# Patient Record
Sex: Male | Born: 1980 | Race: White | Hispanic: No | Marital: Married | Smoking: Former smoker
Health system: Southern US, Community
[De-identification: ages and names within clinical notes are randomized; demographics above are authoritative.]

## PROBLEM LIST (undated history)

## (undated) DIAGNOSIS — I10 Essential (primary) hypertension: Secondary | ICD-10-CM

## (undated) HISTORY — DX: Essential (primary) hypertension: I10

---

## 2014-08-02 ENCOUNTER — Ambulatory Visit: Payer: Self-pay | Admitting: Physician Assistant

## 2014-11-24 ENCOUNTER — Ambulatory Visit
Admit: 2014-11-24 | Disposition: A | Discharge status: Home / Self Care | Payer: Self-pay | Attending: Family Medicine | Admitting: Family Medicine

## 2015-12-21 ENCOUNTER — Encounter: Payer: Self-pay | Admitting: Emergency Medicine

## 2015-12-21 ENCOUNTER — Ambulatory Visit
Admission: EM | Admit: 2015-12-21 | Discharge: 2015-12-21 | Disposition: A | Discharge status: Home / Self Care | Payer: BLUE CROSS/BLUE SHIELD | Attending: Family Medicine | Admitting: Family Medicine

## 2015-12-21 DIAGNOSIS — H6123 Impacted cerumen, bilateral: Secondary | ICD-10-CM

## 2015-12-21 MED ORDER — CARBAMIDE PEROXIDE 6.5 % OT SOLN: 5.0000 [drp] | Freq: Two times a day (BID) | OTIC | Status: DC

## 2015-12-21 NOTE — ED Provider Notes (Signed)
CSN: [id number]     Arrival date & time 12/21/15  1914 History   First MD Initiated Contact with Patient 12/21/15 [phone]    Nurses notes were reviewed. Chief Complaint  Patient presents with  . Ear Fullness   (Consider location/radiation/quality/duration/timing/severity/associated sxs/prior Treatment) Patient is a 35 y.o. Chris [name] presenting with plugged ear sensation. The history is provided by the patient.  Ear Fullness This is a new problem. The current episode started more than 2 days ago. The problem occurs constantly. The problem has not changed since onset.Pertinent negatives include no chest pain, no abdominal pain and no headaches. Nothing aggravates the symptoms. Nothing relieves the symptoms. He has tried nothing for the symptoms.    Patient's history of excess wax buildup in the ears. He reports last few days increased pressure in his ears on the left ear he can feel something that there was fill on Saturday but today the right ear was felt completely full quickly came in to be seen. He denies any medical problems no pertinent family medical history unfortunately was warned any stop smoking. No history of drug allergies.    History reviewed. No pertinent past medical history. History reviewed. No pertinent past surgical history. History reviewed. No pertinent family history. Social History  Substance Use Topics  . Smoking status: Current Every Day Smoker -- 1.00 packs/day    Types: Cigarettes  . Smokeless tobacco: None  . Alcohol Use: Yes    Review of Systems  Cardiovascular: Negative for chest pain.  Gastrointestinal: Negative for abdominal pain.  Neurological: Negative for headaches.  All other systems reviewed and are negative.   Allergies  Review of patient's allergies indicates no known allergies.  Home Medications   Prior to Admission medications   Medication Sig Start Date End Date Taking? Authorizing Provider  carbamide peroxide (DEBROX) 6.5 % otic solution  Place 5 drops into both ears 2 (two) times daily. 12/21/15   Hassan Rowan, MD   Meds Ordered and Administered this Visit  Medications - No data to display  BP 137/87 mmHg  Pulse Chris  Temp(Src) 97 F (36.1 C) (Tympanic)  Resp 16  Ht  (1.651 m)  Wt 150 lb (68.04 kg)  BMI 24.96 kg/m2  SpO2 100% No data found.   Physical Exam  Constitutional: He is oriented to person, place, and time. He appears well-developed and well-nourished.  HENT:  Head: Normocephalic and atraumatic.  Right Ear: A foreign body is present.  Left Ear: A foreign body is present.  Nose: Right sinus exhibits no maxillary sinus tenderness and no frontal sinus tenderness. Left sinus exhibits no maxillary sinus tenderness and no frontal sinus tenderness.  Mouth/Throat: Oropharynx is clear and moist and mucous membranes are normal. No dental caries.  Eyes: Conjunctivae are normal. Pupils are equal, round, and reactive to light.  Neck: Normal range of motion.  Musculoskeletal: Normal range of motion.  Neurological: He is oriented to person, place, and time.  Skin: Skin is warm.  Vitals reviewed.   ED Course  .Ear Cerumen Removal Date/Time: 12/21/2015 9:45 AM Performed by: Hassan Rowan Authorized by: Hassan Rowan Consent: Verbal consent obtained. Local anesthetic: none Location: Both ears were impacted with cerumen. Procedure type: irrigation Patient sedated: no Comments: Both ears were irrigated by the nurse with removal removal of large amount of earwax. Patient tolerated procedure well. Recheck both ears revealed both her and ear canal look great.   (including critical care time)  Labs Review Labs Reviewed - No data to  display  Imaging Review No results found.   Visual Acuity Review  Right Eye Distance:   Left Eye Distance:   Bilateral Distance:    Right Eye Near:   Left Eye Near:    Bilateral Near:         MDM   1. Excessive cerumen in both ear canals    Both ears irrigated with  removal of cerumen Debrox prescription will be given and start patient not smoke anymore. He declined a work note for today.     Note: This dictation was prepared with Dragon dictation along with smaller phrase technology. Any transcriptional errors that result from this process are unintentional.  Hassan RowanEugene Violette Morneault, MD 12/21/15 [phone]

## 2015-12-21 NOTE — ED Notes (Signed)
Patient c/o fullness and pain in both his ears since Saturday.

## 2015-12-21 NOTE — Discharge Instructions (Signed)
Cerumen Impaction °The structures of the external ear canal secrete a waxy substance known as cerumen. Excess cerumen can build up in the ear canal, causing a condition known as cerumen impaction. Cerumen impaction can cause ear pain and disrupt the function of the ear. °The rate of cerumen production differs for each individual. In certain individuals, the configuration of the ear canal may decrease his or her ability to naturally remove cerumen. °CAUSES °Cerumen impaction is caused by excessive cerumen production or buildup. °RISK FACTORS °· Frequent use of swabs to clean ears. °· Having narrow ear canals. °· Having eczema. °· Being dehydrated. °SIGNS AND SYMPTOMS °· Diminished hearing. °· Ear drainage. °· Ear pain. °· Ear itch. °TREATMENT °Treatment may involve: °· Over-the-counter or prescription ear drops to soften the cerumen. °· Removal of cerumen by a health care provider. This may be done with: °· Irrigation with warm water. This is the most common method of removal. °· Ear curettes and other instruments. °· Surgery. This may be done in severe cases. °HOME CARE INSTRUCTIONS °· Take medicines only as directed by your health care provider. °· Do not insert objects into the ear with the intent of cleaning the ear. °PREVENTION °· Do not insert objects into the ear, even with the intent of cleaning the ear. Removing cerumen as a part of normal hygiene is not necessary, and the use of swabs in the ear canal is not recommended. °· Drink enough water to keep your urine clear or pale yellow. °· Control your eczema if you have it. °SEEK MEDICAL CARE IF: °· You develop ear pain. °· You develop bleeding from the ear. °· The cerumen does not clear after you use ear drops as directed. °  °This information is not intended to replace advice given to you by your health care provider. Make sure you discuss any questions you have with your health care provider. °  °Document Released: 09/13/2004 Document Revised: 08/27/2014  Document Reviewed: 03/23/2015 °Elsevier Interactive Patient Education ©2016 [employer] ° °Ear Drops, Adult °You have been diagnosed with a condition requiring you to put drops of medicine into your outer ear. °HOME CARE INSTRUCTIONS  °· Put drops in the affected ear as instructed. After putting the drops in, you will need to lie down with the affected ear facing up for ten minutes so the drops will remain in the ear canal and run down and fill the canal. Continue using the ear drops for as long as directed by your health care provider. °· Prior to getting up, put a cotton ball gently in your ear canal. Leave enough of the cotton ball out so it can be easily removed. Do not attempt to push this down into the canal with a cotton-tipped swab or other instrument. °· Do not irrigate or wash out your ears if you have had a perforated eardrum or mastoid surgery, or unless instructed to do so by your health care provider. °· Keep appointments with your health care provider as instructed. °· Finish all medicine, or use for the length of time prescribed by your health care provider. Continue the drops even if your problem seems to be doing well after a couple days, or continue as instructed. °SEEK MEDICAL CARE IF: °· You become worse or develop increasing pain. °· You notice any unusual drainage from your ear (particularly if the drainage has a bad smell). °· You develop hearing difficulties. °· You experience a serious form of dizziness in which you feel as if the   room is spinning, and you feel nauseated (vertigo). °· The outside of your ear becomes red or swollen or both. This may be a sign of an allergic reaction. °MAKE SURE YOU:  °· Understand these instructions. °· Will watch your condition. °· Will get help right away if you are not doing well or get worse. °  °This information is not intended to replace advice given to you by your health care provider. Make sure you discuss any questions you have with your health  care provider. °  °Document Released: 07/31/2001 Document Revised: 08/27/2014 Document Reviewed: 03/03/2013 °Elsevier Interactive Patient Education ©2016 [employer]. ° °
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
## 2017-12-16 ENCOUNTER — Ambulatory Visit
Admission: EM | Admit: 2017-12-16 | Discharge: 2017-12-16 | Disposition: A | Discharge status: Home / Self Care | Payer: BLUE CROSS/BLUE SHIELD | Attending: Emergency Medicine | Admitting: Emergency Medicine

## 2017-12-16 ENCOUNTER — Ambulatory Visit (INDEPENDENT_AMBULATORY_CARE_PROVIDER_SITE_OTHER): Payer: BLUE CROSS/BLUE SHIELD

## 2017-12-16 ENCOUNTER — Encounter: Payer: Self-pay | Admitting: *Deleted

## 2017-12-16 DIAGNOSIS — M65332 Trigger finger, left middle finger: Secondary | ICD-10-CM | POA: Diagnosis not present

## 2017-12-16 MED ORDER — MELOXICAM 15 MG PO TABS: 15.0000 mg | ORAL_TABLET | Freq: Every day | ORAL | 0 refills | Status: DC

## 2017-12-16 NOTE — ED Triage Notes (Signed)
Pt left middle finger hurts and is unable to movet. Pt does not remember having any kind of injury to it. Pain to that finger started Friday.

## 2017-12-16 NOTE — Discharge Instructions (Signed)
Continue to wear your splint, I have prescribed an antiinflammatory to take daily, follow up with [facility] for further evaluation as this condition often may require joint injection or possibly surgery.

## 2017-12-16 NOTE — ED Provider Notes (Signed)
[facility]    CSN: [id number] Arrival date & time: 12/16/17  1217     History   Chief Complaint Chief Complaint  Patient presents with  . Finger Injury    HPI Chris [name] is a 37 y.o. male.   Chris [name] is a 37 y.o. male who presents for evaluation of left middle finger pain and immobility for 2 days. He denies any traumatic cause, has not fallen, had any other source of injury. He is right handed, works as a Psychologist, occupational for a [employer]Has not had any condition similar to this in the past. Pain is primarily when his hands are clenched in a fist Otherwise states it's a minor discomfort. He has not had any over-the-counter therapies, he has splinted his finger, which states it has helped.  The history is provided by the patient.    History reviewed. No pertinent past medical history.  There are no active problems to display for this patient.   History reviewed. No pertinent surgical history.     Home Medications    Prior to Admission medications   Medication Sig Start Date End Date Taking? Authorizing Provider  meloxicam (MOBIC) 15 MG tablet Take 1 tablet (15 mg total) by mouth daily. 12/16/17   Dorena Bodo, NP    Family History History reviewed. No pertinent family history.  Social History Social History   Tobacco Use  . Smoking status: Former Smoker    Packs/day: 1.00    Types: Cigarettes  . Smokeless tobacco: Current User    Types: Chew  Substance Use Topics  . Alcohol use: Yes    Alcohol/week: 3.6 oz    Types: 6 Cans of beer per week    Comment: daily  . Drug use: Never     Allergies   Patient has no known allergies.   Review of Systems Review of Systems  Constitutional: Negative for chills and fever.  Gastrointestinal: Negative for nausea.  Musculoskeletal: Positive for joint swelling.  Skin: Negative.   Neurological: Negative.      Physical Exam Triage Vital Signs ED Triage Vitals  Enc Vitals  Group     BP 12/16/17 1255 (!) 142/93     Pulse Rate 12/16/17 1255 86     Resp 12/16/17 1255 16     Temp 12/16/17 1255 98.4 F (36.9 C)     Temp Source 12/16/17 1255 Oral     SpO2 12/16/17 1255 100 %     Weight 12/16/17 1252 150 lb (68 kg)     Height 12/16/17 1252  (1.651 m)     Head Circumference --      Peak Flow --      Pain Score 12/16/17 1252 0     Pain Loc --      Pain Edu? --      Excl. in GC? --    No data found.  Updated Vital Signs BP (!) 142/93 (BP Location: Left Arm)   Pulse 86   Temp 98.4 F (36.9 C) (Oral)   Resp 16   Ht  (1.651 m)   Wt 150 lb (68 kg)   SpO2 100%   BMI 24.96 kg/m   Visual Acuity Right Eye Distance:   Left Eye Distance:   Bilateral Distance:    Right Eye Near:   Left Eye Near:    Bilateral Near:     Physical Exam  Musculoskeletal: He exhibits no edema or tenderness.  Left hand: He exhibits normal capillary refill and no deformity.  Left third digit flexed at 30 degrees, unable to actively extend, no pain with passive extension, capillary refill less than 2 seconds, sensation intact distally.   Neurological: He is alert.  Skin: Skin is warm and dry. Capillary refill takes less than 2 seconds. No erythema.  Nursing note and vitals reviewed.    UC Treatments / Results  Labs (all labs ordered are listed, but only abnormal results are displayed) Labs Reviewed - No data to display  EKG None  Radiology Dg Finger Middle Left  Result Date: 12/16/2017 CLINICAL DATA:  Pain mid to distal phalanx EXAM: LEFT THIRD FINGER 2+V COMPARISON:  None. FINDINGS: Frontal, oblique, and lateral views were obtained. No fracture or dislocation. Joint spaces appear normal. No erosive change. No radiopaque foreign body. IMPRESSION: No fracture or dislocation.  No evident arthropathy. Electronically Signed   By: Bretta Bang III M.D.   On: 12/16/2017 13:25    Procedures Procedures (including critical care time)  Medications Ordered  in UC Medications - No data to display  Initial Impression / Assessment and Plan / UC Course  I have reviewed the triage vital signs and the nursing notes.  Pertinent labs & imaging results that were available during my care of the patient were reviewed by me and considered in my medical decision making (see chart for details).     No evidence of fracture or dislocation, likely trigger finger. Continue finger brace and rx of NSAIDs. Recommend follow up with orthopedics for further evaluation and management.  Final Clinical Impressions(s) / UC Diagnoses   Final diagnoses:  Trigger middle finger of left hand   Discharge Instructions   None    ED Prescriptions    None     Controlled Substance Prescriptions [place] Controlled Substance Registry consulted? Not Applicable   Dorena Bodo, NP 12/16/17 [phone]

## 2019-06-01 IMAGING — CR DG FINGER MIDDLE 2+V*L*
3 series · 3 of 3 positions shown · non-contrast
Comparison: None.

CLINICAL DATA: Pain mid to distal phalanx

EXAM:
LEFT THIRD FINGER 2+V

[finger ap]
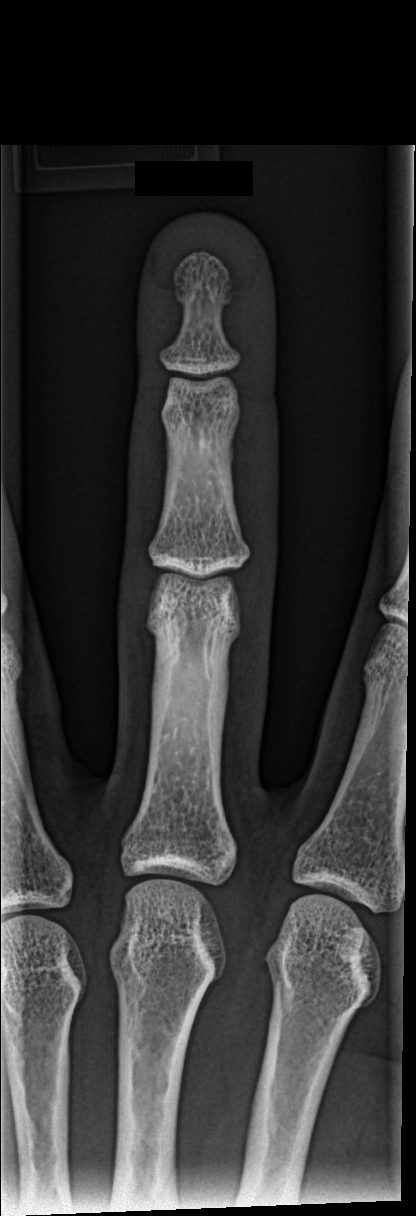

[finger obl]
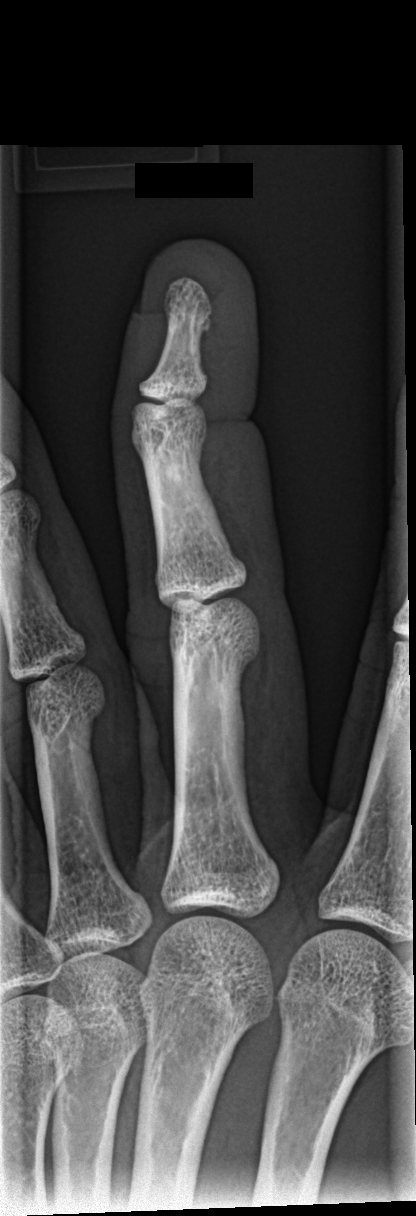

[finger lat]
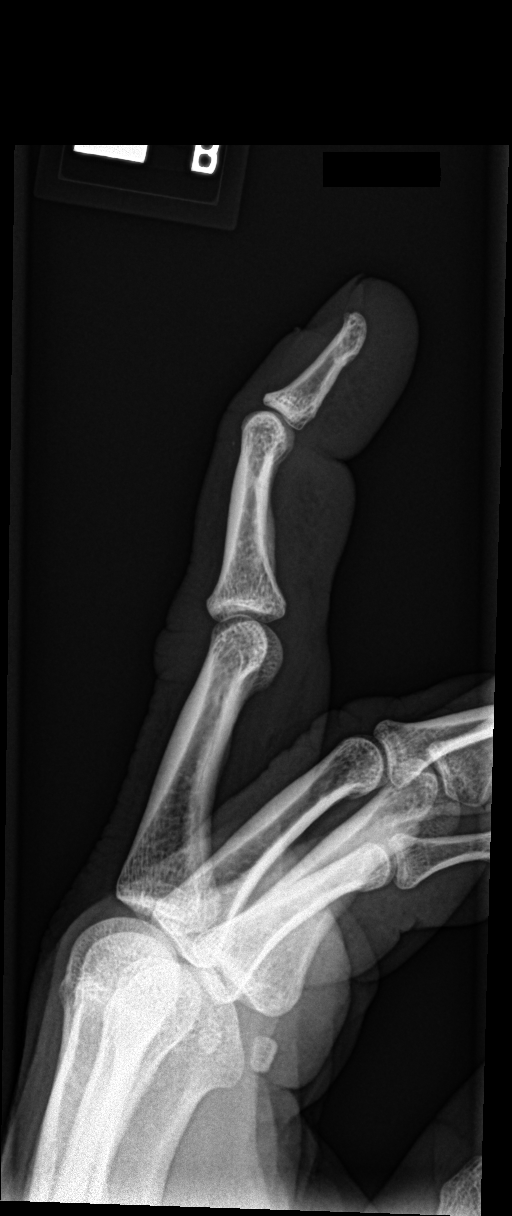

[3 of 3 positions shown; findings below may reference images not displayed]

FINDINGS: Frontal, oblique, and lateral views were obtained. No fracture or
dislocation. Joint spaces appear normal. No erosive change. No
radiopaque foreign body.
IMPRESSION: No fracture or dislocation.  No evident arthropathy.

## 2020-09-02 ENCOUNTER — Other Ambulatory Visit: Payer: BC Managed Care – PPO

## 2020-09-02 ENCOUNTER — Other Ambulatory Visit: Payer: Self-pay

## 2020-09-02 DIAGNOSIS — Z20822 Contact with and (suspected) exposure to covid-19: Secondary | ICD-10-CM

## 2020-09-06 LAB — NOVEL CORONAVIRUS, NAA: SARS-CoV-2, NAA: NOT DETECTED

## 2021-10-26 ENCOUNTER — Encounter: Payer: Self-pay | Admitting: Emergency Medicine

## 2021-10-26 ENCOUNTER — Other Ambulatory Visit: Payer: Self-pay

## 2021-10-26 ENCOUNTER — Emergency Department
Admission: EM | Admit: 2021-10-26 | Discharge: 2021-10-26 | Disposition: A | Discharge status: Home / Self Care | Payer: BC Managed Care – PPO | Attending: Emergency Medicine | Admitting: Emergency Medicine

## 2021-10-26 ENCOUNTER — Emergency Department: Payer: BC Managed Care – PPO

## 2021-10-26 DIAGNOSIS — R0789 Other chest pain: Secondary | ICD-10-CM | POA: Insufficient documentation

## 2021-10-26 DIAGNOSIS — I10 Essential (primary) hypertension: Secondary | ICD-10-CM | POA: Insufficient documentation

## 2021-10-26 DIAGNOSIS — M546 Pain in thoracic spine: Secondary | ICD-10-CM | POA: Diagnosis not present

## 2021-10-26 DIAGNOSIS — R079 Chest pain, unspecified: Secondary | ICD-10-CM | POA: Diagnosis not present

## 2021-10-26 LAB — BASIC METABOLIC PANEL
Anion gap: 9 (ref 5–15)
BUN: 8 mg/dL (ref 6–20)
CO2: 28 mmol/L (ref 22–32)
Calcium: 9.1 mg/dL (ref 8.9–10.3)
Chloride: 99 mmol/L (ref 98–111)
Creatinine, Ser: 0.8 mg/dL (ref 0.61–1.24)
GFR, Estimated: >60 mL/min (ref >60)
Glucose, Bld: 121 mg/dL — ABNORMAL HIGH (ref 70–99)
Potassium: 3.8 mmol/L (ref 3.5–5.1)
Sodium: 136 mmol/L (ref 135–145)

## 2021-10-26 LAB — CBC
HCT: 45.7 % (ref 39.0–52.0)
Hemoglobin: 15.3 g/dL (ref 13.0–17.0)
MCH: 31.4 pg (ref 26.0–34.0)
MCHC: 33.5 g/dL (ref 30.0–36.0)
MCV: 93.6 fL (ref 80.0–100.0)
Platelets: 177 10*3/uL (ref 150–400)
RBC: 4.88 MIL/uL (ref 4.22–5.81)
RDW: 12.1 % (ref 11.5–15.5)
WBC: 6.5 10*3/uL (ref 4.0–10.5)
nRBC: 0 % (ref 0.0–0.2)

## 2021-10-26 LAB — TROPONIN I (HIGH SENSITIVITY): Troponin I (High Sensitivity): 3 ng/L (ref <18)

## 2021-10-26 NOTE — ED Provider Notes (Signed)
? ?[facility] ?Provider Note ? ? ? Event Date/Time  ? First MD Initiated Contact with Patient 10/26/21 1650   ?  (approximate) ? ? ?History  ? ?Chest Pain ? ? ?HPI ? ?Chris [name] is a 41 y.o. male here with chest pain.  The patient states that earlier today, he developed acute, transient, sharp, upper chest pain.  He states that he had some upper back pain when he left work, but did not hurt in his chest.  This resolved and now he had a sharp, somewhat positional, anterior chest pain.  This now has resolved.  He states it was slightly worse certain position changes.  No nausea.  No vomiting.  Denies any specific injuries.  No recent falls.  No numbness or weakness.  No radiation down his arms.  No personal or family history of aortic disease or cardiac disease. ?  ? ? ?Physical Exam  ? ?Triage Vital Signs: ?ED Triage Vitals  ?Enc Vitals Group  ?   BP 10/26/21 1530 (!) 177/99  ?   Pulse Rate 10/26/21 1530 87  ?   Resp 10/26/21 1530 17  ?   Temp 10/26/21 1530 98.5 ?F (36.9 ?C)  ?   Temp src --   ?   SpO2 10/26/21 1530 97 %  ?   Weight 10/26/21 1529 170 lb (77.1 kg)  ?   Height 10/26/21 1529 5\' 5"  (1.651 m)  ?   Head Circumference --   ?   Peak Flow --   ?   Pain Score 10/26/21 1529 1  ?   Pain Loc --   ?   Pain Edu? --   ?   Excl. in GC? --   ? ? ?Most recent vital signs: ?Vitals:  ? 10/26/21 1530  ?BP: (!) 177/99  ?Pulse: 87  ?Resp: 17  ?Temp: 98.5 ?F (36.9 ?C)  ?SpO2: 97%  ? ? ? ?General: Awake, no distress.  ?CV:  Good peripheral perfusion.  Pulses 2+ and symmetric, including bilateral radial and DP pulses. ?Resp:  Normal effort.  Lungs clear to auscultation bilaterally. ?Abd:  No distention.  No tenderness. ?Other:  No edema. ? ? ?ED Results / Procedures / Treatments  ? ?Labs ?(all labs ordered are listed, but only abnormal results are displayed) ?Labs Reviewed  ?BASIC METABOLIC PANEL - Abnormal; Notable for the following components:  ?    Result Value  ? Glucose, Bld 121 (*)   ?  All other components within normal limits  ?CBC  ?TROPONIN I (HIGH SENSITIVITY)  ?TROPONIN I (HIGH SENSITIVITY)  ? ? ? ?EKG ?Normal sinus rhythm, ventricular rate 76.  PR 132, QRS 96, QTc 450.  No acute ST elevations or depressions. ? ? ?RADIOLOGY ?Chest x-ray: No acute abnormality ? ? ?I also independently reviewed and agree wit radiologist interpretations. ? ? ?PROCEDURES: ? ?Critical Care performed: No ? ? ?MEDICATIONS ORDERED IN ED: ?Medications - No data to display ? ? ?IMPRESSION / MDM / ASSESSMENT AND PLAN / ED COURSE  ?I reviewed the triage vital signs and the nursing notes. ?             ?               ? ?MDM:  ?41 year old male here with fairly atypical, transient, chest pain.  Pain is completely resolved now.  EKG is nonischemic.  Chest x-ray is clear.  Pulses are 2+ and symmetric.  CBC and BMP unremarkable.  Troponin negative  despite symptoms multiple hours ago, do not suspect ACS.  Patient has no risk factors or clinical signs to suggest dissection or PE.  He is PERC negative.  I discussed very low likelihood of dissection, but we had a discussion about the risks and benefits of CT angio.  Given the complete resolution of his symptoms, absence of any tearing component or radiation, and resolution of his hypertension after rooming in the ED, with no history of the same, he would like to hold on CT at this time which I think is reasonable.  Will discharge with supportive care and outpatient follow-up. ? ?Prior to repeat blood pressure in both arms, patient eloped from the ED.  He was in stable condition. ? ? ?MEDICATIONS GIVEN IN ED: ?Medications - No data to display ? ? ?Consults:  ?None ? ? ? ?FINAL CLINICAL IMPRESSION(S) / ED DIAGNOSES  ? ?Final diagnoses:  ?Atypical chest pain  ? ? ? ?Rx / DC Orders  ? ?ED Discharge Orders   ? ? None  ? ?  ? ? ? ?Note:  This document was prepared using Dragon voice recognition software and may include unintentional dictation errors. ?  ?Shaune Pollack, MD ?10/26/21  2011 ? ?

## 2021-10-26 NOTE — ED Triage Notes (Signed)
Pt via POV from home. Pt c/o L sided CP, denies radiation. States it is hard to take a deep breath. States this AM. Denies any cough. Denies cardiac hx. Pt is A&OX4 and NAD.  ?

## 2021-10-26 NOTE — ED Notes (Signed)
RN went to d/c pt and pt was not to be found. RN checked pt bathroom and lobby. RN notified provider that pt was not able to get D/C papers. ?

## 2021-11-01 ENCOUNTER — Telehealth: Payer: Self-pay

## 2021-11-01 NOTE — Telephone Encounter (Signed)
This is Terri Disney's nephew.   She wants to know if you will take him as a patient.  Patient was seen in the ER  for HBP and chest pain on 10/26/21.

## 2021-11-01 NOTE — Telephone Encounter (Signed)
No, not taking new patients

## 2021-11-02 NOTE — Telephone Encounter (Signed)
Terri notified.

## 2022-06-07 ENCOUNTER — Emergency Department: Payer: BC Managed Care – PPO

## 2022-06-07 ENCOUNTER — Emergency Department
Admission: EM | Admit: 2022-06-07 | Discharge: 2022-06-07 | Disposition: A | Discharge status: Home / Self Care | Payer: BC Managed Care – PPO | Attending: Student in an Organized Health Care Education/Training Program | Admitting: Student in an Organized Health Care Education/Training Program

## 2022-06-07 ENCOUNTER — Encounter: Payer: Self-pay | Admitting: Emergency Medicine

## 2022-06-07 ENCOUNTER — Other Ambulatory Visit: Payer: Self-pay

## 2022-06-07 DIAGNOSIS — S43006A Unspecified dislocation of unspecified shoulder joint, initial encounter: Secondary | ICD-10-CM | POA: Insufficient documentation

## 2022-06-07 DIAGNOSIS — M25512 Pain in left shoulder: Secondary | ICD-10-CM | POA: Diagnosis not present

## 2022-06-07 DIAGNOSIS — S43015A Anterior dislocation of left humerus, initial encounter: Secondary | ICD-10-CM | POA: Diagnosis not present

## 2022-06-07 DIAGNOSIS — S43005A Unspecified dislocation of left shoulder joint, initial encounter: Secondary | ICD-10-CM | POA: Diagnosis not present

## 2022-06-07 DIAGNOSIS — M7989 Other specified soft tissue disorders: Secondary | ICD-10-CM | POA: Diagnosis not present

## 2022-06-07 DIAGNOSIS — S42252A Displaced fracture of greater tuberosity of left humerus, initial encounter for closed fracture: Secondary | ICD-10-CM | POA: Diagnosis not present

## 2022-06-07 DIAGNOSIS — S40912A Unspecified superficial injury of left shoulder, initial encounter: Secondary | ICD-10-CM | POA: Diagnosis not present

## 2022-06-07 DIAGNOSIS — S42202A Unspecified fracture of upper end of left humerus, initial encounter for closed fracture: Secondary | ICD-10-CM | POA: Diagnosis not present

## 2022-06-07 MED ORDER — OXYCODONE-ACETAMINOPHEN 5-325 MG PO TABS: 1.0000 | ORAL_TABLET | ORAL | 0 refills | Status: DC | PRN

## 2022-06-07 MED ORDER — OXYCODONE-ACETAMINOPHEN 5-325 MG PO TABS
1.0000 | ORAL_TABLET | Freq: Once | ORAL | Status: AC
  Administered 2022-06-07: 1 via ORAL
  Filled 2022-06-07: qty 1

## 2022-06-07 MED ORDER — ONDANSETRON HCL 4 MG/2ML IJ SOLN
4.0000 mg | Freq: Once | INTRAMUSCULAR | Status: AC
  Administered 2022-06-07: 4 mg via INTRAVENOUS
  Filled 2022-06-07: qty 2

## 2022-06-07 MED ORDER — MORPHINE SULFATE (PF) 4 MG/ML IV SOLN
4.0000 mg | INTRAVENOUS | Status: DC | PRN
  Administered 2022-06-07: 4 mg via INTRAVENOUS
  Filled 2022-06-07: qty 1

## 2022-06-07 MED ORDER — LIDOCAINE HCL (PF) 1 % IJ SOLN
10.0000 mL | Freq: Once | INTRAMUSCULAR | Status: AC
  Administered 2022-06-07: 10 mL via INTRADERMAL
  Filled 2022-06-07: qty 10
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
## 2022-06-09 ENCOUNTER — Other Ambulatory Visit: Payer: Self-pay

## 2022-06-09 ENCOUNTER — Emergency Department
Admission: EM | Admit: 2022-06-09 | Discharge: 2022-06-10 | Disposition: A | Discharge status: Home / Self Care | Payer: BC Managed Care – PPO | Attending: Emergency Medicine | Admitting: Emergency Medicine

## 2022-06-09 ENCOUNTER — Emergency Department: Payer: BC Managed Care – PPO

## 2022-06-09 DIAGNOSIS — S43032A Inferior subluxation of left humerus, initial encounter: Secondary | ICD-10-CM | POA: Diagnosis not present

## 2022-06-09 DIAGNOSIS — S4491XD Injury of unspecified nerve at shoulder and upper arm level, right arm, subsequent encounter: Secondary | ICD-10-CM | POA: Insufficient documentation

## 2022-06-09 DIAGNOSIS — E871 Hypo-osmolality and hyponatremia: Secondary | ICD-10-CM | POA: Insufficient documentation

## 2022-06-09 DIAGNOSIS — R202 Paresthesia of skin: Secondary | ICD-10-CM | POA: Diagnosis not present

## 2022-06-09 DIAGNOSIS — S42252A Displaced fracture of greater tuberosity of left humerus, initial encounter for closed fracture: Secondary | ICD-10-CM | POA: Diagnosis not present

## 2022-06-09 DIAGNOSIS — E876 Hypokalemia: Secondary | ICD-10-CM | POA: Diagnosis not present

## 2022-06-09 LAB — CBC WITH DIFFERENTIAL/PLATELET
Abs Immature Granulocytes: 0.04 10*3/uL (ref 0.00–0.07)
Basophils Absolute: 0.1 10*3/uL (ref 0.0–0.1)
Basophils Relative: 1 %
Eosinophils Absolute: 0.3 10*3/uL (ref 0.0–0.5)
Eosinophils Relative: 3 %
HCT: 42.9 % (ref 39.0–52.0)
Hemoglobin: 14.2 g/dL (ref 13.0–17.0)
Immature Granulocytes: 1 %
Lymphocytes Relative: 29 %
Lymphs Abs: 2.3 10*3/uL (ref 0.7–4.0)
MCH: 31.7 pg (ref 26.0–34.0)
MCHC: 33.1 g/dL (ref 30.0–36.0)
MCV: 95.8 fL (ref 80.0–100.0)
Monocytes Absolute: 0.6 10*3/uL (ref 0.1–1.0)
Monocytes Relative: 8 %
Neutro Abs: 4.7 10*3/uL (ref 1.7–7.7)
Neutrophils Relative %: 58 %
Platelets: 178 10*3/uL (ref 150–400)
RBC: 4.48 MIL/uL (ref 4.22–5.81)
RDW: 11.8 % (ref 11.5–15.5)
WBC: 8.1 10*3/uL (ref 4.0–10.5)
nRBC: 0 % (ref 0.0–0.2)

## 2022-06-09 LAB — BASIC METABOLIC PANEL
Anion gap: 11 (ref 5–15)
BUN: 8 mg/dL (ref 6–20)
CO2: 25 mmol/L (ref 22–32)
Calcium: 8.7 mg/dL — ABNORMAL LOW (ref 8.9–10.3)
Chloride: 97 mmol/L — ABNORMAL LOW (ref 98–111)
Creatinine, Ser: 0.59 mg/dL — ABNORMAL LOW (ref 0.61–1.24)
GFR, Estimated: >60 mL/min (ref >60)
Glucose, Bld: 124 mg/dL — ABNORMAL HIGH (ref 70–99)
Potassium: 3.3 mmol/L — ABNORMAL LOW (ref 3.5–5.1)
Sodium: 133 mmol/L — ABNORMAL LOW (ref 135–145)

## 2022-06-09 NOTE — ED Triage Notes (Signed)
Pt arrives with had shoulder dislocation of left shoulder Wednesday and then had it reduced on Thursday. Per pt, he does not have feeling in his left arm. Pt does have a radial pulse. Pt has a bruising on under side of left arm.

## 2022-06-10 NOTE — Discharge Instructions (Signed)
As we discussed, there is no new issue with your arm.  Unfortunately nerve injuries after dislocation can take a long time to heal, and sometimes (rarely) people require surgery.  Your best bet is to follow-up with orthopedic surgery as scheduled.  Continue to use your sling as much as is possible to help protect your arm and prevent reinjury.  Return to the emergency department if you develop new or worsening symptoms that concern you.
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
## 2022-06-11 DIAGNOSIS — Z8739 Personal history of other diseases of the musculoskeletal system and connective tissue: Secondary | ICD-10-CM | POA: Diagnosis not present

## 2022-06-11 DIAGNOSIS — S5402XA Injury of ulnar nerve at forearm level, left arm, initial encounter: Secondary | ICD-10-CM | POA: Diagnosis not present

## 2022-07-02 DIAGNOSIS — Z8739 Personal history of other diseases of the musculoskeletal system and connective tissue: Secondary | ICD-10-CM | POA: Diagnosis not present

## 2022-07-02 DIAGNOSIS — R200 Anesthesia of skin: Secondary | ICD-10-CM | POA: Diagnosis not present

## 2022-07-02 DIAGNOSIS — S5402XA Injury of ulnar nerve at forearm level, left arm, initial encounter: Secondary | ICD-10-CM | POA: Diagnosis not present

## 2022-07-06 ENCOUNTER — Other Ambulatory Visit: Payer: Self-pay | Admitting: Physician Assistant

## 2022-07-06 DIAGNOSIS — Z8739 Personal history of other diseases of the musculoskeletal system and connective tissue: Secondary | ICD-10-CM

## 2022-07-06 DIAGNOSIS — S5402XA Injury of ulnar nerve at forearm level, left arm, initial encounter: Secondary | ICD-10-CM

## 2022-07-20 ENCOUNTER — Ambulatory Visit
Admission: RE | Admit: 2022-07-20 | Discharge: 2022-07-20 | Disposition: A | Discharge status: Home / Self Care | Payer: BC Managed Care – PPO | Source: Ambulatory Visit | Attending: Physician Assistant | Admitting: Physician Assistant

## 2022-07-20 DIAGNOSIS — S5402XA Injury of ulnar nerve at forearm level, left arm, initial encounter: Secondary | ICD-10-CM

## 2022-07-20 DIAGNOSIS — S42255A Nondisplaced fracture of greater tuberosity of left humerus, initial encounter for closed fracture: Secondary | ICD-10-CM | POA: Diagnosis not present

## 2022-07-20 DIAGNOSIS — M25512 Pain in left shoulder: Secondary | ICD-10-CM | POA: Diagnosis not present

## 2022-07-20 DIAGNOSIS — Z8739 Personal history of other diseases of the musculoskeletal system and connective tissue: Secondary | ICD-10-CM

## 2022-07-25 DIAGNOSIS — S143XXD Injury of brachial plexus, subsequent encounter: Secondary | ICD-10-CM | POA: Diagnosis not present

## 2022-07-25 DIAGNOSIS — Z8739 Personal history of other diseases of the musculoskeletal system and connective tissue: Secondary | ICD-10-CM | POA: Diagnosis not present

## 2022-07-31 DIAGNOSIS — G540 Brachial plexus disorders: Secondary | ICD-10-CM | POA: Diagnosis not present

## 2022-10-04 DIAGNOSIS — M25512 Pain in left shoulder: Secondary | ICD-10-CM | POA: Diagnosis not present

## 2022-10-11 DIAGNOSIS — M25512 Pain in left shoulder: Secondary | ICD-10-CM | POA: Diagnosis not present

## 2022-10-15 DIAGNOSIS — Z8739 Personal history of other diseases of the musculoskeletal system and connective tissue: Secondary | ICD-10-CM | POA: Diagnosis not present

## 2022-10-17 DIAGNOSIS — M25512 Pain in left shoulder: Secondary | ICD-10-CM | POA: Diagnosis not present

## 2022-10-24 DIAGNOSIS — M25512 Pain in left shoulder: Secondary | ICD-10-CM | POA: Diagnosis not present

## 2022-10-31 DIAGNOSIS — M25512 Pain in left shoulder: Secondary | ICD-10-CM | POA: Diagnosis not present

## 2022-11-07 DIAGNOSIS — M25512 Pain in left shoulder: Secondary | ICD-10-CM | POA: Diagnosis not present

## 2022-11-14 DIAGNOSIS — M25512 Pain in left shoulder: Secondary | ICD-10-CM | POA: Diagnosis not present

## 2022-11-28 DIAGNOSIS — M25512 Pain in left shoulder: Secondary | ICD-10-CM | POA: Diagnosis not present

## 2023-01-01 DIAGNOSIS — G540 Brachial plexus disorders: Secondary | ICD-10-CM | POA: Diagnosis not present

## 2023-03-01 ENCOUNTER — Encounter: Payer: Self-pay | Admitting: Nurse Practitioner

## 2023-03-01 ENCOUNTER — Ambulatory Visit: Payer: BC Managed Care – PPO | Admitting: Nurse Practitioner

## 2023-03-01 ENCOUNTER — Telehealth: Payer: Self-pay

## 2023-03-01 VITALS — BP 146/88 | HR 94 | Temp 98.3°F | Ht 65.5 in | Wt 161.8 lb

## 2023-03-01 DIAGNOSIS — F1010 Alcohol abuse, uncomplicated: Secondary | ICD-10-CM | POA: Diagnosis not present

## 2023-03-01 DIAGNOSIS — Z1329 Encounter for screening for other suspected endocrine disorder: Secondary | ICD-10-CM | POA: Diagnosis not present

## 2023-03-01 DIAGNOSIS — Z13220 Encounter for screening for lipoid disorders: Secondary | ICD-10-CM | POA: Diagnosis not present

## 2023-03-01 DIAGNOSIS — Z8042 Family history of malignant neoplasm of prostate: Secondary | ICD-10-CM

## 2023-03-01 DIAGNOSIS — M20012 Mallet finger of left finger(s): Secondary | ICD-10-CM | POA: Insufficient documentation

## 2023-03-01 DIAGNOSIS — Z0000 Encounter for general adult medical examination without abnormal findings: Secondary | ICD-10-CM

## 2023-03-01 DIAGNOSIS — R030 Elevated blood-pressure reading, without diagnosis of hypertension: Secondary | ICD-10-CM | POA: Diagnosis not present

## 2023-03-01 HISTORY — DX: Encounter for general adult medical examination without abnormal findings: Z00.00

## 2023-03-01 LAB — COMPREHENSIVE METABOLIC PANEL
ALT: 48 U/L (ref 0–53)
AST: 45 U/L — ABNORMAL HIGH (ref 0–37)
Albumin: 4.7 g/dL (ref 3.5–5.2)
Alkaline Phosphatase: 48 U/L (ref 39–117)
BUN: 7 mg/dL (ref 6–23)
CO2: 26 mEq/L (ref 19–32)
Calcium: 9.4 mg/dL (ref 8.4–10.5)
Chloride: 102 mEq/L (ref 96–112)
Creatinine, Ser: 0.71 mg/dL (ref 0.40–1.50)
GFR: 113.47 mL/min (ref >60.00)
Glucose, Bld: 95 mg/dL (ref 70–99)
Potassium: 3.9 mEq/L (ref 3.5–5.1)
Sodium: 139 mEq/L (ref 135–145)
Total Bilirubin: 0.5 mg/dL (ref 0.2–1.2)
Total Protein: 7.5 g/dL (ref 6.0–8.3)

## 2023-03-01 LAB — CBC WITH DIFFERENTIAL/PLATELET
Basophils Absolute: 0.1 10*3/uL (ref 0.0–0.1)
Basophils Relative: 1.4 % (ref 0.0–3.0)
Eosinophils Absolute: 0.2 10*3/uL (ref 0.0–0.7)
Eosinophils Relative: 4 % (ref 0.0–5.0)
HCT: 45.5 % (ref 39.0–52.0)
Hemoglobin: 15.3 g/dL (ref 13.0–17.0)
Lymphocytes Relative: 24.5 % (ref 12.0–46.0)
Lymphs Abs: 1.2 10*3/uL (ref 0.7–4.0)
MCHC: 33.7 g/dL (ref 30.0–36.0)
MCV: 94.9 fl (ref 78.0–100.0)
Monocytes Absolute: 0.4 10*3/uL (ref 0.1–1.0)
Monocytes Relative: 9.1 % (ref 3.0–12.0)
Neutro Abs: 2.9 10*3/uL (ref 1.4–7.7)
Neutrophils Relative %: 61 % (ref 43.0–77.0)
Platelets: 162 10*3/uL (ref 150.0–400.0)
RBC: 4.79 Mil/uL (ref 4.22–5.81)
RDW: 12 % (ref 11.5–15.5)
WBC: 4.7 10*3/uL (ref 4.0–10.5)

## 2023-03-01 LAB — LIPID PANEL
Cholesterol: 210 mg/dL — ABNORMAL HIGH (ref 0–200)
HDL: 60.9 mg/dL (ref >39.00)
NonHDL: 149.27
Total CHOL/HDL Ratio: 3
Triglycerides: 224 mg/dL — ABNORMAL HIGH (ref 0.0–149.0)
VLDL: 44.8 mg/dL — ABNORMAL HIGH (ref 0.0–40.0)

## 2023-03-01 LAB — TSH: TSH: 0.69 u[IU]/mL (ref 0.35–5.50)

## 2023-03-01 LAB — LDL CHOLESTEROL, DIRECT: Direct LDL: 98 mg/dL

## 2023-03-01 LAB — PSA: PSA: 0.56 ng/mL (ref 0.10–4.00)
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
## 2023-03-01 NOTE — Telephone Encounter (Signed)
Provider put in check out notes for pt to return in 3 weeks but pt was scheduled 3 months instead.   Provider would like pt rescheduled for the 3 weeks like planned.

## 2023-03-01 NOTE — Assessment & Plan Note (Addendum)
Elevated reading x 2 today in office. He has never been treated for hypertension before. Patient will start checking his blood pressure daily at home and keep a log to bring to his next appointment. Blood pressure log provided to patient. He will follow up in 3 weeks to review his home readings, if consistently elevated will start on medication at that time. Advised to decrease salt and alcohol intake. Information on DASH diet and hypertension provided to patient. Counseled on health risks of uncontrolled hypertension. Will continue to monitor closely.

## 2023-03-01 NOTE — Assessment & Plan Note (Signed)
Physical exam complete. Lab work as outlined. Will contact patient with results. PSA screening today due to family hx of prostate cancer in father. Flu vaccine not due. Tetanus vaccine- unsure, declined today. Encouraged to return to get this or get at local pharmacy when ready. Declined all COVID vaccines. HIV and Hep C screenings declined. Counseled on decreasing alcohol intake. Recommended follow ups with Dentist and Ophthalmology for annual exams. Encouraged to work on [employer] and remain active. Return to care in 3 weeks.

## 2023-03-01 NOTE — Assessment & Plan Note (Signed)
10 beers daily, occasionally more. Counseled on health risks of increased alcohol intake. Counseled on decreasing alcohol intake to 0-2 drinks per day. Advised to slowly decrease alcohol intake over time, not to stop all at once. He does not feel that he has a problem. He does not want help with his alcohol intake. Information provided to patient. Will continue to monitor.

## 2023-03-21 NOTE — Progress Notes (Signed)
  Bethanie Dicker, NP-C Phone: [phone]  Chris [name] is a 42 y.o. male who presents today for follow up.   Elevated Blood Pressure- Patient has been checking his blood pressure daily at home.  Home BP Monitoring- 130-140s/90-100 Chest pain- No    Dyspnea- No Medications Compliance-  None. Lightheadedness-  No  Edema- No BMET    Component Value Date/Time   NA 139 03/01/2023 1022   K 3.9 03/01/2023 1022   CL 102 03/01/2023 1022   CO2 26 03/01/2023 1022   GLUCOSE 95 03/01/2023 1022   BUN 7 03/01/2023 1022   CREATININE 0.71 03/01/2023 1022   CALCIUM 9.4 03/01/2023 1022   GFRNONAA >60 06/09/2022 2136    Social History   Tobacco Use  Smoking Status Former   Current packs/day: 0.00   Types: Cigarettes   Quit date: 05/20/2016   Years since quitting: 6.8  Smokeless Tobacco Current   Types: Chew    No current outpatient medications on file prior to visit.   No current facility-administered medications on file prior to visit.    ROS see history of present illness  Objective  Physical Exam Vitals:   03/22/23 1301  BP: 138/86  Pulse: 79  Temp: 98.7 F (37.1 C)  SpO2: 99%    BP Readings from Last 3 Encounters:  03/22/23 138/86  03/01/23 (!) 146/88  06/10/22 (!) 138/106   Wt Readings from Last 3 Encounters:  03/22/23 155 lb 12.8 oz (70.7 kg)  03/01/23 161 lb 12.8 oz (73.4 kg)  06/09/22 169 lb (76.7 kg)    Physical Exam Constitutional:      General: He is not in acute distress.    Appearance: Normal appearance.  HENT:     Head: Normocephalic.  Cardiovascular:     Rate and Rhythm: Normal rate and regular rhythm.     Heart sounds: Normal heart sounds.  Pulmonary:     Effort: Pulmonary effort is normal.     Breath sounds: Normal breath sounds.  Skin:    General: Skin is warm and dry.  Neurological:     General: No focal deficit present.     Mental Status: He is alert.  Psychiatric:        Mood and Affect: Mood normal.        Behavior: Behavior  normal.    Assessment/Plan: Please see individual problem list.  Primary hypertension Assessment & Plan: BP not at goal today. Home blood pressure readings consistently elevated over 130/90. Will start patient on Losartan 25 mg daily. He will continue to check his blood pressure daily and keep a log to bring with him to his next appointment. He will follow up in 2 weeks for a blood pressure check with nursing. Return precautions given to patient. Will continue to monitor.   Orders: -     Losartan Potassium; Take 1 tablet (25 mg total) by mouth daily.  Dispense: 90 tablet; Refill: 0   Return in about 2 weeks (around 04/05/2023) for Blood pressure check with nursing.   Bethanie Dicker, NP-C  [facility] - [employer]

## 2023-03-22 ENCOUNTER — Ambulatory Visit: Payer: BC Managed Care – PPO | Admitting: Nurse Practitioner

## 2023-03-22 ENCOUNTER — Encounter: Payer: Self-pay | Admitting: Nurse Practitioner

## 2023-03-22 VITALS — BP 138/86 | HR 79 | Temp 98.7°F | Ht 65.5 in | Wt 155.8 lb

## 2023-03-22 DIAGNOSIS — I10 Essential (primary) hypertension: Secondary | ICD-10-CM | POA: Diagnosis not present

## 2023-03-22 MED ORDER — LOSARTAN POTASSIUM 25 MG PO TABS: 25.0000 mg | ORAL_TABLET | Freq: Every day | ORAL | 0 refills | Status: AC

## 2023-03-22 NOTE — Assessment & Plan Note (Signed)
BP not at goal today. Home blood pressure readings consistently elevated over 130/90. Will start patient on Losartan 25 mg daily. He will continue to check his blood pressure daily and keep a log to bring with him to his next appointment. He will follow up in 2 weeks for a blood pressure check with nursing. Return precautions given to patient. Will continue to monitor.

## 2023-04-09 ENCOUNTER — Ambulatory Visit: Payer: BC Managed Care – PPO

## 2023-04-09 NOTE — Progress Notes (Signed)
Pt came in today for a blood pressure check. Pt had no complaints of chest pain or discomfort. Pt blood was 138/84

## 2023-06-04 ENCOUNTER — Ambulatory Visit: Payer: BC Managed Care – PPO | Admitting: Nurse Practitioner
